# Patient Record
Sex: Male | Born: 2000 | Race: White | Hispanic: No | Smoking: Never smoker
Health system: Southern US, Community
[De-identification: ages and names within clinical notes are randomized; demographics above are authoritative.]

---
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
## 2021-01-27 ENCOUNTER — Ambulatory Visit (INDEPENDENT_AMBULATORY_CARE_PROVIDER_SITE_OTHER): Payer: BC Managed Care – PPO | Admitting: Adult Health

## 2021-01-27 ENCOUNTER — Encounter: Payer: Self-pay | Admitting: Adult Health

## 2021-01-27 ENCOUNTER — Other Ambulatory Visit: Payer: Self-pay

## 2021-01-27 DIAGNOSIS — F411 Generalized anxiety disorder: Secondary | ICD-10-CM | POA: Diagnosis not present

## 2021-01-27 DIAGNOSIS — G4700 Insomnia, unspecified: Secondary | ICD-10-CM | POA: Diagnosis not present

## 2021-01-27 NOTE — Progress Notes (Signed)
Jim Day [id number] 2000/02/01 21 y.o.  Subjective:   Patient ID:  Jim Day is a 21 y.o. (DOB 2000-07-15) male.  Chief Complaint: No chief complaint on file.   HPI Jim Day presents to the office today for follow-up of GAD and insomnia.  Describes mood today as "ok - tired". Pleasant. Denies tearfulness. Mood symptoms - denies depression - not really. Feels anxious and irritable at times - "for the most part". Reports a few panic attacks - "one to two". Stating "I'm not sure how I feel" - slept two hours and is tired.  Reports mood inconsistencies - "my mood can change pretty fast". Reports mind racing. Feels like the addition of Xanax has been helpful - not taking every day - "just when I need it". Stating "I can tell a difference with it". Stable interest and motivation. Taking medications as prescribed.  Energy levels stable. Active, does not have a regular exercise routine. Walking dogs. Enjoys some usual interests and activities. Dating - has a girlfriend of 10 months. She lives in [place]. Lives at home with parents - younger sister. Spending time with family. Appetite adequate. Weight stable. Sleeping difficulties. Typically averages 3 to 4 hours. Focus and concentration difficulties - "just a little bit". Completing tasks. Managing aspects of household. 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  Denies SI or HI.  Denies AH or VH.  Denies recent self harm.  Diagnosed with ADHD - took medications through 10th grade.   Previous medication trials:  Concerta, Zoloft, Prozac, Propranolol, Hydroxyzine  Review of Systems:  Review of Systems  Musculoskeletal:  Negative for gait problem.  Neurological:  Negative for tremors.  Psychiatric/Behavioral:         Please refer to HPI   Medications: I have reviewed the patient's current medications.  Current Outpatient Medications  Medication Sig Dispense Refill   ALPRAZolam (XANAX) 1 MG tablet Take 1 tablet (1 mg  total) by mouth daily as needed for anxiety. 30 tablet 2   No current facility-administered medications for this visit.    Medication Side Effects: None  Allergies:  Allergies  Allergen Reactions   Latex     No past medical history on file.  Past Medical History, Surgical history, Social history, and Family history were reviewed and updated as appropriate.   Please see review of systems for further details on the patient's review from today.   Objective:   Physical Exam:  There were no vitals taken for this visit.  Physical Exam Constitutional:      General: He is not in acute distress. Musculoskeletal:        General: No deformity.  Neurological:     Mental Status: He is alert and oriented to person, place, and time.     Coordination: Coordination normal.  Psychiatric:        Attention and Perception: Attention and perception normal. He does not perceive auditory or visual hallucinations.        Mood and Affect: Mood normal. Mood is not anxious or depressed. Affect is not labile, blunt, angry or inappropriate.        Speech: Speech normal.        Behavior: Behavior normal.        Thought Content: Thought content normal. Thought content is not paranoid or delusional. Thought content does not include homicidal or suicidal ideation. Thought content does not include homicidal or suicidal plan.        Cognition and Memory: Cognition and  memory normal.        Judgment: Judgment normal.     Comments: Insight intact    Lab Review:  No results found for: NA, K, CL, CO2, GLUCOSE, BUN, CREATININE, CALCIUM, PROT, ALBUMIN, AST, ALT, ALKPHOS, BILITOT, GFRNONAA, GFRAA  No results found for: WBC, RBC, HGB, HCT, PLT, MCV, MCH, MCHC, RDW, LYMPHSABS, MONOABS, EOSABS, BASOSABS  No results found for: POCLITH, LITHIUM   No results found for: PHENYTOIN, PHENOBARB, VALPROATE, CBMZ   .res Assessment: Plan:    Plan:   Continue Xanax 1mg  daily as needed for anxiety.  RTC 4  weeks  Patient advised to contact office with any questions, adverse effects, or acute worsening in signs and symptoms.   Time spent with patient was 15 minutes. Greater than 50% of face to face time with patient was spent on counseling and coordination of care.     Discussed potential benefits, risk, and side effects of benzodiazepines to include potential risk of tolerance and dependence, as well as possible drowsiness. Advised patient not to drive if experiencing drowsiness and to take lowest possible effective dose to minimize risk of dependence and tolerance.   Diagnoses and all orders for this visit:  Insomnia, unspecified type  GAD (generalized anxiety disorder)     Please see After Visit Summary for patient specific instructions.  Future Appointments  Date Time Provider Department Center  02/24/2021  4:40 PM Mateus Rewerts, 04/24/2021, NP [facility] None    No orders of the defined types were placed in this encounter.   -------------------------------

## 2021-02-24 ENCOUNTER — Ambulatory Visit: Payer: BC Managed Care – PPO | Admitting: Adult Health

## 2021-02-24 NOTE — Progress Notes (Signed)
Patient no show appointment. ? ?

## 2021-04-28 ENCOUNTER — Ambulatory Visit: Payer: 59 | Admitting: Internal Medicine

## 2021-11-16 ENCOUNTER — Ambulatory Visit: Payer: BC Managed Care – PPO | Admitting: Internal Medicine

## 2021-11-16 ENCOUNTER — Encounter: Payer: Self-pay | Admitting: Internal Medicine

## 2021-11-16 VITALS — BP 109/63 | HR 77 | Temp 98.3°F | Resp 12 | Ht 67.0 in | Wt 140.6 lb

## 2021-11-16 DIAGNOSIS — F419 Anxiety disorder, unspecified: Secondary | ICD-10-CM | POA: Diagnosis not present

## 2021-11-16 DIAGNOSIS — Z0000 Encounter for general adult medical examination without abnormal findings: Secondary | ICD-10-CM

## 2021-11-16 DIAGNOSIS — R634 Abnormal weight loss: Secondary | ICD-10-CM

## 2021-11-16 DIAGNOSIS — F17200 Nicotine dependence, unspecified, uncomplicated: Secondary | ICD-10-CM

## 2021-11-16 DIAGNOSIS — F17211 Nicotine dependence, cigarettes, in remission: Secondary | ICD-10-CM | POA: Diagnosis not present

## 2021-11-16 DIAGNOSIS — B350 Tinea barbae and tinea capitis: Secondary | ICD-10-CM | POA: Diagnosis not present

## 2021-11-16 HISTORY — DX: Nicotine dependence, unspecified, uncomplicated: F17.200

## 2021-11-16 HISTORY — DX: Tinea barbae and tinea capitis: B35.0

## 2021-11-16 HISTORY — DX: Abnormal weight loss: R63.4

## 2021-11-16 HISTORY — DX: Anxiety disorder, unspecified: F41.9

## 2021-11-16 MED ORDER — CLOTRIMAZOLE 1 % EX CREA: 1.0000 | TOPICAL_CREAM | Freq: Two times a day (BID) | CUTANEOUS | 11 refills | Status: AC

## 2021-11-16 NOTE — Assessment & Plan Note (Signed)
I talked to him for a while today and my impression is that he is not bipolar or OCD although maybe a little bit of trait for both I am going to avoid prescribing any psychiatric meds as a result because I am not sure on what the underlying causes for his anxiety are and he will call his normal psychiatrist and get back

## 2021-11-16 NOTE — Assessment & Plan Note (Signed)
Gave cancer screening recommendations to him in detail today Also gave healthy eating guidelines Will follow up in 1 year or sooner if he worsens (see AVS)

## 2021-11-16 NOTE — Progress Notes (Signed)
Today's healthcare provider: [name], MD  Phone: [phone]  New patient visit  Visit Date: 11/16/2021 Patient: Jim Day   DOB: 2001-01-15   21 y.o. Male  MRN: [id number]  Assessment and Plan:     Jim Day was seen today for establish care, discuss vaccines, ringworm and discuss sleeping issues.  Preventative health care -     CBC with Differential/Platelet -     Comprehensive metabolic panel -     Lipid panel  Tinea barbae Overview: He had has tinea corporis all over his body chest neck face and has not resolved with Lotrimin for 6 months we discussed the algorithm for treatment and I offered KOH prep, dermatology referral, antifungal cream continuation, by mouth antifungals.  For now he is just going to do the antifungal cream but it if he wants to call later for the dermatology or the by mouth antifungals I can send them in without an appointment assured him   Anxiety Overview: Managed by psychiatry I gave the number to get rescheduled as he missed an appointment  Assessment & Plan: I talked to him for a while today and my impression is that he is not bipolar or OCD although maybe a little bit of trait for both I am going to avoid prescribing any psychiatric meds as a result because I am not sure on what the underlying causes for his anxiety are and he will call his normal psychiatrist and get back  Orders: -     Clotrimazole; Apply 1 Application topically 2 (two) times daily.  Dispense: 100 g; Refill: 11  Cigarette nicotine dependence in remission  Recent weight loss Assessment & Plan: Gave cancer screening recommendations to him in detail today Also gave healthy eating guidelines Will follow up in 1 year or sooner if he worsens (see AVS)   Today's Health Maintenance Counseling and Anticipatory Guidance:  Eye exams:  Every 1-2 years was suggested Dental Health:  Reminded about importance of regular tooth brushing, flossing, and dental visits q6  months Sinus Hygiene:  Recommended daily sinus rinsing with sterile saline mist.  Cardiovascular Risk Factor Reduction:   Advised patient of need for regular exercise and diet rich and fruits and vegetables (high in fiber) and healthy fats (omega-3 unsaturated fats in fish/nuts/avocados/evoo) to reduce risk of heart attack and stroke. Avoid first and 2nd hand smoke and stimulants.   Avoid extreme exercise, only exercise in moderation Nutrition and Weight Management: recommended a diet to achieve a lean physique, in addition to a heart-healthy diet.  Avoid fattening foods with carbohydrates unless resistance training. Wt Readings from Last 3 Encounters:  11/16/21 140 lb 9.6 oz (63.8 kg)  12/20/18 125 lb 12.8 oz (57.1 kg) (17 %, Z= -0.95)*  12/04/18 125 lb (56.7 kg) (16 %, Z= -0.98)*   * Growth percentiles are based on CDC (Boys, 2-20 Years) data.   Body mass index is 22.02 kg/m. /  Substance use:  I discussed my recommendation of total abstinence from all substances of abuse including smoke(including 2nd hand), alcohol, illicit drugs, inhalants, sugar.   Offered to assist with any substance use disorders or addictions.   STD screening: testing offered today, but patient declined as he considers himself to be low risk based on his sexual history    Sleep Apnea screening:  He  denies any significant problems with sleep quality or hypersomnolence or being advised that he has been told that he snores or has apnea Depression screening: we discussed- he prefers  to get this managed by psychiatrist so the data is more privileged.  He does endorse anxiety and trauma history. Doubts bipolar.  Mild compulsivity endorsed.      No data to display         Counseled on value of gratitude exercises, mindfulness, physical exercise, social connections, and good sleep.  Encouraged him to use [facility] therapy chat for enlightenment counseling. Injury prevention: Discussed safety belts, safety helmets, smoke detectors.   Avoid adventure sports and high impact exercises. Health maintenance and immunizations reviewed and he was encouraged to complete anything that is due:  There is no immunization history on file for this patient. Health Maintenance Due  Topic Date Due   HPV VACCINES (1 - Male 2-dose series) Never done   TETANUS/TDAP  Never done   He declined both recommended vaccines for now after discussing in detail. Return to care in 1 year for next preventative visit.   Today's Cancer Screening Guidance: Penile & Testicular cancer screening:  He was advised to palpate his testicles, scrotum, and penis for masses and inform me of any.   Prostate cancer screening:  Denies family history of prostate cancer or hematospermia so too young for screening by current guidelines. No results found for: "PSA"     Colon cancer screening:  Denies strong family history of colon cancer or blood in stool so no screening is indicated until age 13.   Thyroid cancer screening:  encouraged to self exam thyroid for nodules every 6-12 months Skin cancer screening:  Advised regular sunscreen use.  Showed him pictures of melanomas for reference.  He denies worrisome, changing, or new skin lesions.    Health Maintenance  Topic Date Due   HPV VACCINES (1 - Male 2-dose series) Never done   TETANUS/TDAP  Never done   INFLUENZA VACCINE  Discontinued   Hepatitis C Screening  Discontinued   HIV Screening  Discontinued    Recommended follow up: No follow-ups on file.  Subjective:  Patient presents today to establish care.  Prior patient of pediatrics Dr. Benard Halsted..  Chief Complaint  Patient presents with   Establish Care    Fasting today, possibly open to blood work.   Discuss vaccines    Tdap and HPV.   Ringworm    Face, needs prescription cream.   Discuss sleeping issues    Goes to bed late and doesn't get much sleep. Doesn't really feel tired.    And when asked about his health goals he mentions he wants to try to  eat better wants to try to get more enlightened and also he wants to ask about his sleep but he does not really perceive it to be a problem like insomnia rather just he is listening to his body and he does not feel the need to get a better way  For history taking, I took a per problem history from the patient and chart review as follows: Problem  Tinea Barbae   He had has tinea corporis all over his body chest neck face and has not resolved with Lotrimin for 6 months we discussed the algorithm for treatment and I offered KOH prep, dermatology referral, antifungal cream continuation, by mouth antifungals.  For now he is just going to do the antifungal cream but it if he wants to call later for the dermatology or the by mouth antifungals I can send them in without an appointment assured him   Anxiety   Managed by psychiatry I gave the number  to get rescheduled as he missed an appointment   Nicotine Dependence  Recent Weight Loss     Depression Screen     No data to display         No results found for any visits on 11/16/21.   The following were reviewed and entered/updated in epic: Past Medical History:  Diagnosis Date   Anxiety 11/16/2021   Managed by psychiatry I gave the number to get rescheduled as he missed an appointment   Nicotine dependence 11/16/2021   Recent weight loss 11/16/2021   Tinea barbae 11/16/2021   He had has tinea corporis all over his body chest neck face and has not resolved with Lotrimin for 6 months we discussed the algorithm for treatment and I offered KOH prep, dermatology referral, antifungal cream continuation, by mouth antifungals.  For now he is just going to do the antifungal cream but it if he wants to call later for the dermatology or the by mouth antifungals I can send them in   Past Surgical History:  Procedure Laterality Date   KNEE CARTILAGE SURGERY  2020   Past Surgical History:  Procedure Laterality Date   KNEE CARTILAGE SURGERY  2020    Family Status  Relation Name Status   Mother  Alive   Father  Alive   Sister Samson Frederic Alive       Younger   MGM  Alive   MGF  Alive   PGM  Alive   PGF  Alive   Family History  Problem Relation Age of Onset   Healthy Mother    Healthy Father    Outpatient Medications Prior to Visit  Medication Sig Dispense Refill   ALPRAZolam (XANAX) 1 MG tablet Take 1 tablet (1 mg total) by mouth daily as needed for anxiety. (Patient taking differently: Take 1 mg by mouth daily as needed for anxiety. PRN) 30 tablet 2   No facility-administered medications prior to visit.    Allergies  Allergen Reactions   Latex    Social History   Tobacco Use   Smoking status: Never   Smokeless tobacco: Current  Substance Use Topics   Alcohol use: No    Alcohol/week: 0.0 standard drinks of alcohol   Drug use: No     There is no immunization history on file for this patient.  Review of Systems  Constitutional:  Positive for weight loss. Negative for chills, diaphoresis, fever and malaise/fatigue.  HENT:  Negative for congestion, ear discharge, ear pain, hearing loss, nosebleeds, sinus pain, sore throat and tinnitus.   Eyes:  Negative for blurred vision, double vision, photophobia, pain, discharge and redness.  Respiratory:  Negative for cough, hemoptysis, sputum production, shortness of breath, wheezing and stridor.   Cardiovascular:  Negative for chest pain, palpitations, orthopnea, claudication, leg swelling and PND.  Gastrointestinal:  Positive for constipation. Negative for abdominal pain, blood in stool, diarrhea, heartburn, melena, nausea and vomiting.  Genitourinary:  Negative for dysuria, flank pain, frequency, hematuria and urgency.  Musculoskeletal:  Negative for back pain, falls, joint pain, myalgias and neck pain.  Skin:  Negative for itching and rash.  Neurological:  Negative for dizziness, tingling, tremors, sensory change, speech change, focal weakness, seizures, loss of consciousness,  weakness and headaches.  Endo/Heme/Allergies:  Negative for environmental allergies and polydipsia. Does not bruise/bleed easily.  Psychiatric/Behavioral:  Positive for depression. Negative for hallucinations, memory loss, substance abuse and suicidal ideas. The patient is nervous/anxious (difficulty falling asleep, poor appetite, stress, irritability, racing thoughts, mood  swings, anxiety follows with psych) and has insomnia.      Objective:  BP 109/63 (BP Location: Right Arm, Patient Position: Sitting)   Pulse 77   Temp 98.3 F (36.8 C) (Temporal)   Resp 12   Ht 5\' 7"  (1.702 m)   Wt 140 lb 9.6 oz (63.8 kg)   SpO2 98%   BMI 22.02 kg/m  Body mass index is 22.02 kg/m.  He  is a very cordial and polite person who was a pleasure to meet.  Gen: NAD, resting comfortably HEENT: Mucous membranes are moist. Sclera conjunctiva and lids grossly normal Neck: no thyromegaly, no cervical lymphadenopathy CV: RRR no murmurs rubs or gallops Lungs: CTAB no crackles, wheeze, rhonchi\Ext: no edema Skin: warm, dry Neuro: grossly intact

## 2021-11-16 NOTE — Patient Instructions (Addendum)
It was a pleasure seeing you today! I truly hope you feel like you received 5 star service and please let me know if there is anything I can improve.  [name], MD   Today the plan is...  Preventative health care  Tinea barbae       [x]  No follow-ups on file.   [x]  If your condition begins to worsen or become severe:  GO to the ER  [x]  If you are not doing well: RETURN to the office sooner  [x]  If you have follow-up questions / concerns: please contact me  -via phone [phone] OR MyChart messaging   [x]  Please bring all your medicines to each appointment  [x]   You will be contacted with the lab results as soon as they are available. For any labs or imaging tests, we will call you if the results are significantly abnormal.  Most normal results will be posted to myChart as soon as they are available and I will comment on them there within 2-3 business days.   [x]  Billing questions for xray and lab orders are billed from separate companies and questions/concerns should be directed to the [facility].  For visit charges please discuss with our administrative services.  [x]  You will be contacted with the lab results as soon as they are available. The fastest way to get your results is to activate your My Chart account. Instructions are located on the last page of this paperwork. If you have not heard from [place] regarding the results in 2 weeks, please contact this office.

## 2022-11-18 ENCOUNTER — Encounter: Payer: Self-pay | Admitting: Internal Medicine

## 2022-11-18 ENCOUNTER — Ambulatory Visit (INDEPENDENT_AMBULATORY_CARE_PROVIDER_SITE_OTHER): Payer: BC Managed Care – PPO | Admitting: Internal Medicine

## 2022-11-18 VITALS — BP 98/60 | HR 64 | Temp 98.0°F | Ht 67.0 in | Wt 139.0 lb

## 2022-11-18 DIAGNOSIS — G44219 Episodic tension-type headache, not intractable: Secondary | ICD-10-CM

## 2022-11-18 DIAGNOSIS — E65 Localized adiposity: Secondary | ICD-10-CM | POA: Diagnosis not present

## 2022-11-18 DIAGNOSIS — L989 Disorder of the skin and subcutaneous tissue, unspecified: Secondary | ICD-10-CM

## 2022-11-18 DIAGNOSIS — R1115 Cyclical vomiting syndrome unrelated to migraine: Secondary | ICD-10-CM

## 2022-11-18 DIAGNOSIS — Z72821 Inadequate sleep hygiene: Secondary | ICD-10-CM

## 2022-11-18 DIAGNOSIS — D229 Melanocytic nevi, unspecified: Secondary | ICD-10-CM | POA: Diagnosis not present

## 2022-11-18 DIAGNOSIS — B350 Tinea barbae and tinea capitis: Secondary | ICD-10-CM

## 2022-11-18 DIAGNOSIS — Z0001 Encounter for general adult medical examination with abnormal findings: Secondary | ICD-10-CM | POA: Diagnosis not present

## 2022-11-18 DIAGNOSIS — R634 Abnormal weight loss: Secondary | ICD-10-CM

## 2022-11-18 DIAGNOSIS — Z23 Encounter for immunization: Secondary | ICD-10-CM | POA: Diagnosis not present

## 2022-11-18 DIAGNOSIS — G43009 Migraine without aura, not intractable, without status migrainosus: Secondary | ICD-10-CM

## 2022-11-18 DIAGNOSIS — F419 Anxiety disorder, unspecified: Secondary | ICD-10-CM | POA: Diagnosis not present

## 2022-11-18 DIAGNOSIS — Z0000 Encounter for general adult medical examination without abnormal findings: Secondary | ICD-10-CM

## 2022-11-18 LAB — LIPID PANEL
Cholesterol: 151 mg/dL (ref 0–200)
HDL: 66.5 mg/dL (ref >39.00)
LDL Cholesterol: 71 mg/dL (ref 0–99)
NonHDL: 84.4
Total CHOL/HDL Ratio: 2
Triglycerides: 65 mg/dL (ref 0.0–149.0)
VLDL: 13 mg/dL (ref 0.0–40.0)

## 2022-11-18 LAB — COMPREHENSIVE METABOLIC PANEL
ALT: 46 U/L (ref 0–53)
AST: 29 U/L (ref 0–37)
Albumin: 4.8 g/dL (ref 3.5–5.2)
Alkaline Phosphatase: 56 U/L (ref 39–117)
BUN: 14 mg/dL (ref 6–23)
CO2: 28 meq/L (ref 19–32)
Calcium: 9.6 mg/dL (ref 8.4–10.5)
Chloride: 104 meq/L (ref 96–112)
Creatinine, Ser: 0.98 mg/dL (ref 0.40–1.50)
GFR: 110.34 mL/min (ref >60.00)
Glucose, Bld: 92 mg/dL (ref 70–99)
Potassium: 4.1 meq/L (ref 3.5–5.1)
Sodium: 141 meq/L (ref 135–145)
Total Bilirubin: 0.4 mg/dL (ref 0.2–1.2)
Total Protein: 7.3 g/dL (ref 6.0–8.3)

## 2022-11-18 LAB — CBC WITH DIFFERENTIAL/PLATELET
Basophils Absolute: 0 10*3/uL (ref 0.0–0.1)
Basophils Relative: 0.5 % (ref 0.0–3.0)
Eosinophils Absolute: 0.1 10*3/uL (ref 0.0–0.7)
Eosinophils Relative: 2 % (ref 0.0–5.0)
HCT: 41.9 % (ref 39.0–52.0)
Hemoglobin: 14.2 g/dL (ref 13.0–17.0)
Lymphocytes Relative: 42.9 % (ref 12.0–46.0)
Lymphs Abs: 1.9 10*3/uL (ref 0.7–4.0)
MCHC: 33.8 g/dL (ref 30.0–36.0)
MCV: 89.7 fL (ref 78.0–100.0)
Monocytes Absolute: 0.3 10*3/uL (ref 0.1–1.0)
Monocytes Relative: 6.4 % (ref 3.0–12.0)
Neutro Abs: 2.1 10*3/uL (ref 1.4–7.7)
Neutrophils Relative %: 48.2 % (ref 43.0–77.0)
Platelets: 323 10*3/uL (ref 150.0–400.0)
RBC: 4.68 Mil/uL (ref 4.22–5.81)
RDW: 12.7 % (ref 11.5–15.5)
WBC: 4.4 10*3/uL (ref 4.0–10.5)

## 2022-11-18 NOTE — Progress Notes (Signed)
[facility]: [phone]   -- Annual Preventive Medical Office Visit --  Patient:  Jim Day      Age: 22 y.o.       Sex:  male  Date:   11/18/2022 Patient Care Team: Lula Olszewski, MD as PCP - General (Internal Medicine) Mozingo, Thereasa Solo, NP as Nurse Practitioner (Psychiatry) Today's Healthcare Provider: Lula Olszewski, MD  ========================================= Chief Complaint  Patient presents with   Annual Exam    Pt here for annual exam - pt states that he has a mole on his back he would like to have looked at - pt fasting. Pt open to discuss HPV and Tdap vaccine    Purpose of Visit: Comprehensive preventive health assessment and personalized health maintenance planning.  This encounter was conducted as a Comprehensive Physical Exam (CPE) preventive care annual visit. The patient's medical history and problem list were reviewed to inform individualized preventive care recommendations. No problem-specific medical treatment was provided during this visit.    Assessment & Plan Encounter for annual health examination  Preventative health care  Recent weight loss  Anxiety Off medications No longer following with behavioral health  Cyclic vomiting syndrome  Episodic tension-type headache, not intractable  Migraine without aura and without status migrainosus, not intractable  Poor sleep hygiene  Tinea barbae  Encounter for annual general medical examination with abnormal findings in adult  Skin lesion  Melanocytic neoplasm of skin  Central adiposity  Assessment and Plan    Melanocytic Neoplasm   A dark, varying, and growing mole on his back warrants an urgent referral to dermatology for evaluation and possible excision due to its concerning features.  Anxiety   He reports a mild anxiety disorder and has been previously prescribed Xanax by [facility], which he uses rarely for panic attacks. We will prescribe 12  tablets of Xanax for rare use and encourage continued follow-up with psychiatry.  Poor Sleep Hygiene   He reports late sleep times and variable sleep duration but no daytime sleepiness or snoring. We will educate him on the importance of a consistent sleep schedule for overall health.  General Health Maintenance   We will administer the Tdap vaccine today, order baseline blood work, and encourage regular dental visits, eye exams, and self-examination for testicular cancer. Regular exercise and a healthy diet, including daily avocado intake, are recommended. We advise on the use of nasal filters or sinus sprays for occupational dust exposure.      Diagnoses and all orders for this visit: Encounter for annual health examination Preventative health care Recent weight loss Anxiety Cyclic vomiting syndrome Episodic tension-type headache, not intractable Migraine without aura and without status migrainosus, not intractable Poor sleep hygiene Tinea barbae Encounter for annual general medical examination with abnormal findings in adult Skin lesion -     Ambulatory referral to Dermatology Melanocytic neoplasm of skin -     Ambulatory referral to Dermatology Central adiposity -     Tdap vaccine greater than or equal to 7yo IM -     Ambulatory referral to Dermatology -     Lipid panel -     Comprehensive metabolic panel -     CBC with Differential/Platelet -     TSH Rfx on Abnormal to Free T4 -     Lipid Panel w/reflex Direct LDL   Today's Health Maintenance Counseling and Anticipatory Guidance:  Eye exams:  We encouraged patient to to complete eye exam every 1-2 years.  He  reports his last eye exam was:  2 years   Dental health:  He reports he has been keeping up with his dental visits.  He intends to do so in the future.  We encourage regular tooth brushing/flossing. Sinus health:  We encouraged sterile saline nasal misting sinus rinses daily for pollen, to reduce allergies and risk for  sinus infections.   Sterile can based misting products are recommended due to superior misting and ease of maintaining sterility Sleep Apnea screening:  We encourage self monitoring of sleep quality with SnoreLab App and other tools/apps that are now available at retail []  Do you snore loudly - only rarely not loud []  Tired, fatigued, or sleepy during the daytime []  Witness apneas []  Significant hypertension []  BMI greater than 35    []  Age older than 21 years old []  Has large neck size over 15.7 in [x]  Male Total Score: 1  Cardiovascular Risk Factor Reduction:   Advised patient of need for regular exercise and diet rich and fruits and vegetables and healthy fats to reduce risk of heart attack and stroke.  Avoid first- and second-hand smoke and stimulants.   Avoid extreme exercise- exercise in moderation (150 minutes per week is a good goal) Discussed health benefits of physical activity, and encouraged him to engage in regular exercise appropriate for his age and condition. Exercise Activities and Dietary recommendations  Goals   None    Wt Readings from Last 3 Encounters:  11/18/22 139 lb (63 kg)  11/16/21 140 lb 9.6 oz (63.8 kg)  12/20/18 125 lb 12.8 oz (57.1 kg) (17%, Z= -0.95)*   * Growth percentiles are based on CDC (Boys, 2-20 Years) data.  Body mass index is 21.77 kg/m. Health maintenance and immunizations reviewed and he was encouraged to complete anything that is due: Immunization History  Administered Date(s) Administered   Tdap 11/18/2022   There are no preventive care reminders to display for this patient.  Health Maintenance  Topic Date Due   COVID-19 Vaccine (1) 12/04/2022 (Originally 07/08/2006)   HPV VACCINES (1 - Risk male 3-dose series) 11/18/2023 (Originally 07/07/2012)   DTaP/Tdap/Td (2 - Td or Tdap) 11/17/2032   INFLUENZA VACCINE  Discontinued   Hepatitis C Screening  Discontinued   HIV Screening  Discontinued   Lifestyle / Social History Reviewed:  Social  History   Tobacco Use   Smoking status: Never   Smokeless tobacco: Current  Substance Use Topics   Alcohol use: No    Alcohol/week: 0.0 standard drinks of alcohol   Drug use: No    My recommendation is total abstinence from all substances of abuse including smoke and 2nd hand smoke, alcohol, illicit drugs, smoking, inhalants, sugar, high risk sexual behavior Offered to assist with any use disorders or addictions.   Sexual transmitted infection testing offered today, but patient declined as he feels he is low risk based on his sexual history.   Social History   Substance and Sexual Activity  Sexual Activity Never  .      11/18/2022   12:35 PM  Depression screen PHQ 2/9  Decreased Interest 1  Down, Depressed, Hopeless 0  PHQ - 2 Score 1  Altered sleeping 2  Tired, decreased energy 1  Change in appetite 2  Feeling bad or failure about yourself  0  Trouble concentrating 0  Moving slowly or fidgety/restless 1  Suicidal thoughts 0  PHQ-9 Score 7  Difficult doing work/chores Somewhat difficult  Injury prevention: Discussed safety belts, safety helmets,  not texting and driving.  Today's Cancer Screening  Penile/Testicle/Scrotum cancer screening: Asked the patient about genital warts or tumors/abnormalities of penis/testicles/scrotum, encouraged patient to to inform me of any. Patient reports there are none. Thyroid cancer screening: patient advised to check by palpating thyroid for nodules Prostate cancer screening:  Denies family history of prostate cancer or hematospermia so too young for screening by current guidelines.(No results found for: "PSA") Colon cancer screening:  Denies strong family history of colon cancer or blood in stool so no screening is indicated until age 75.   Lung cancer screening:  Current guidelines recommend Individuals aged 32 to 52 who currently smoke or formerly smoked and have a >= 20 pack-year smoking history should undergo annual screening with low-dose  computed tomography (LDCT). Skin cancer screening:  Advised regular sunscreen use. He denies worrisome, changing, or new skin lesions. Showed him pictures of melanomas for reference:  Return to care in 1 year for next preventative visit.      Subjective  22 y.o. male presents today for a complete physical exam.  He reports consuming a general diet. The patient does not participate in regular exercise at present. He generally feels well. He reports sleeping fairly well. He does not have additional problems to discuss today.  HPI HPI     Annual Exam    Additional comments: Pt here for annual exam - pt states that he has a mole on his back he would like to have looked at - pt fasting. Pt open to discuss HPV and Tdap vaccine      Last edited by Samara Deist, CMA on 11/18/2022 12:39 PM.      AI-Extracted: Discussed the use of AI scribe software for clinical note transcription with the patient, who gave verbal consent to proceed.  History of Present Illness   The patient, a young adult, presented for a routine physical examination. The patient reported no specific complaints or health concerns. The patient's lifestyle includes a physically active job and a passion for music. The patient reported a history of attention deficit disorder (ADD) and occasional migraines. The patient also reported a history of chronic nausea and cyclic vomiting syndrome, which has been well-controlled recently.  The patient reported a history of nicotine dependence but has since quit. The patient denied any alcohol issues and reported a stable relationship with a long-term partner. The patient reported poor sleep hygiene, often staying up late, but did not report any daytime sleepiness or snoring.  The patient reported a history of tinea barbae, a fungal infection on the skin, which occasionally flares up with heavy sweating but is generally well-controlled with topical treatment. The patient also reported a  mole on the back that has been present for several years. The mole has darkened and spread, raising concerns about potential melanoma.  The patient reported no regular exercise outside of his physically demanding job. The patient's diet was reported to be dominated by affordability, including frequent consumption of ramen and rice. 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  The patient reported a history of anxiety, which has improved significantly. The patient has not been following up with a therapist or psychiatrist and has been using music as a form of therapy. The patient reported having a small supply of Xanax for occasional use during periods of high anxiety.  In summary, the patient is a young adult with a history of ADD, migraines, chronic nausea, cyclic vomiting syndrome, tinea  barbae, and anxiety. The patient presented for a routine physical examination with no specific complaints. The patient reported poor sleep hygiene and a diet dominated by affordability. The patient has a mole on the back that has darkened and spread, raising concerns about potential melanoma. The patient's anxiety has improved significantly, and he has been using music as a form of therapy.      ROSA comprehensive ROS was negative for any concerning symptoms.  Disclaimer about ROS at Annual Preventive Visits Patients are informed before the Review of Systems (ROS) that identifying significant medical issues during the wellness visit may require immediate attention, potentially resulting in a separate billable encounter beyond the scope of the preventive exam. This disclosure is mandated by professional ethics and legal obligations, as healthcare providers must address any substantial health concerns raised during any patient interaction.  A comprehensive ROS is required by insurance companies for billing the visit. However, this structure may inadvertently discourage  patients from fully disclosing health concerns due to potential financial implications. Consequently, patients often emphasize that any positive ROS findings are related to stable chronic conditions, requesting that these not be discussed during the preventive visit to avoid additional charges. Patients may also ask that reported complaints not be listed in the ROS to prevent affecting billing.  Problem list overviews that were updated at today's visit: Problem  Anxiety   Managed by psychiatry    11/18/2022   12:35 PM  PHQ9 SCORE ONLY  PHQ-9 Total Score 7       11/18/2022   12:36 PM  GAD 7 : Generalized Anxiety Score  Nervous, Anxious, on Edge 1  Control/stop worrying 1  Worry too much - different things 1  Trouble relaxing 2  Restless 2  Easily annoyed or irritable 1  Afraid - awful might happen 0  Total GAD 7 Score 8  Anxiety Difficulty Somewhat difficult       Cyclic Vomiting Syndrome  Nicotine Dependence (Resolved)  Recent Weight Loss (Resolved)   Wt Readings from Last 10 Encounters:  11/18/22 139 lb (63 kg)  11/16/21 140 lb 9.6 oz (63.8 kg)  12/20/18 125 lb 12.8 oz (57.1 kg) (17%, Z= -0.95)*  12/04/18 125 lb (56.7 kg) (16%, Z= -0.98)*  09/08/18 114 lb 9.6 oz (52 kg) (6%, Z= -1.53)*  06/27/14 110 lb 12.8 oz (50.3 kg) (69%, Z= 0.49)*   * Growth percentiles are based on CDC (Boys, 2-20 Years) data.         I attest that I have reviewed and confirmed the patients current medications to meet the medication reconciliation requirement  Current Outpatient Medications on File Prior to Visit  Medication Sig   clotrimazole (ANTIFUNGAL CLOTRIMAZOLE) 1 % cream Apply 1 Application topically 2 (two) times daily.   ALPRAZolam (XANAX) 1 MG tablet Take 1 tablet (1 mg total) by mouth daily as needed for anxiety. (Patient not taking: Reported on 11/18/2022)   No current facility-administered medications on file prior to visit.  There are no discontinued medications. Outpatient  Medications Prior to Visit  Medication Sig   clotrimazole (ANTIFUNGAL CLOTRIMAZOLE) 1 % cream Apply 1 Application topically 2 (two) times daily.   ALPRAZolam (XANAX) 1 MG tablet Take 1 tablet (1 mg total) by mouth daily as needed for anxiety. (Patient not taking: Reported on 11/18/2022)   No facility-administered medications prior to visit.  Review of Systems The following were reviewed and entered/updated into our electronic MEDICAL RECORD NUMBERPast Medical History:  Diagnosis Date   Anxiety 11/16/2021  Managed by psychiatry I gave the number to get rescheduled as he missed an appointment   Nicotine dependence 11/16/2021   Nicotine dependence 11/16/2021   Recent weight loss 11/16/2021   Tinea barbae 11/16/2021   He had has tinea corporis all over his body chest neck face and has not resolved with Lotrimin for 6 months we discussed the algorithm for treatment and I offered KOH prep, dermatology referral, antifungal cream continuation, by mouth antifungals.  For now he is just going to do the antifungal cream but it if he wants to call later for the dermatology or the by mouth antifungals I can send them in   Past Surgical History:  Procedure Laterality Date   KNEE CARTILAGE SURGERY  2020   Social History   Socioeconomic History   Marital status: Significant Other    Spouse name: Not on file   Number of children: Not on file   Years of education: Not on file   Highest education level: Not on file  Occupational History   Not on file  Tobacco Use   Smoking status: Never   Smokeless tobacco: Current  Substance and Sexual Activity   Alcohol use: No    Alcohol/week: 0.0 standard drinks of alcohol   Drug use: No   Sexual activity: Never  Other Topics Concern   Not on file  Social History Narrative   Lesslie is a 12th grade student.   He attends [employer].   He lives with both parents.   He has one sister.   Social Determinants of Corporate investment banker Strain: Not  on file  Food Insecurity: Not on file  Transportation Needs: Not on file  Physical Activity: Not on file  Stress: Not on file  Social Connections: Not on file  Intimate Partner Violence: Not on file   Family Status  Relation Name Status   Mother  Alive   Father  Alive   Sister Samson Frederic Alive       Younger   MGM  Alive   MGF  Alive   PGM  Alive   PGF  Alive  No partnership data on file   Family History  Problem Relation Age of Onset   Healthy Mother    Healthy Father    Allergies  Allergen Reactions   Latex    Patient Care Team: Lula Olszewski, MD as PCP - General (Internal Medicine) Mozingo, Thereasa Solo, NP as Nurse Practitioner (Psychiatry)      Objective  BP 98/60   Pulse 64   Temp 98 F (36.7 C)   Ht 5\' 7"  (1.702 m)   Wt 139 lb (63 kg)   SpO2 98%   BMI 21.77 kg/m  BP Readings from Last 3 Encounters:  11/18/22 98/60  11/16/21 109/63  12/20/18 100/70 (8%, Z = -1.41 /  64%, Z = 0.36)*   *BP percentiles are based on the 2017 AAP Clinical Practice Guideline for boys   Wt Readings from Last 3 Encounters:  11/18/22 139 lb (63 kg)  11/16/21 140 lb 9.6 oz (63.8 kg)  12/20/18 125 lb 12.8 oz (57.1 kg) (17%, Z= -0.95)*   * Growth percentiles are based on CDC (Boys, 2-20 Years) data.      Physical Exam  Wt Readings from Last 3 Encounters:  11/18/22 139 lb (63 kg)  11/16/21 140 lb 9.6 oz (63.8 kg)  12/20/18 125 lb 12.8 oz (57.1 kg) (17%, Z= -0.95)*   * Growth percentiles are based  on CDC (Boys, 2-20 Years) data.  Physical Exam   HEENT: Ears healthy looking, right ear with slight inflammation. Throat normal. NECK: No lumps or bumps. ABDOMEN: No tenderness, no lumps or bumps. SKIN: No acne or significant skin lesions noted.     GEN: NAD, Resting Comfortably. HEENT: Tympanic membranes normal appearing bilaterally, Oropharynx clear, No Thyromegaly noted. No palpable lymphadenopathy or thyroid nodules. CARDIOVASCULAR: S1 and S2 heart sounds have regular  rate and rhythm with no murmurs appreciated. PULMONARY:  Normal work of breathing. Clear to auscultation bilaterally with no crackles, wheezes, or rhonchi. ABDOMEN: Soft, Nontender, Nondistended.  MSK: No edema, cyanosis, or clubbing noted. SKIN: Warm, dry, no lesions of concern observed. NEURO: CN2-12 grossly intact. Strength 5/5 in upper and lower extremities. Reflexes symmetric and intact bilaterally.  PSYCH: Normal affect and thought content, pleasant and cooperative. Photographs Taken 11/18/2022 :      Last depression screening scores    11/18/2022   12:35 PM  PHQ 2/9 Scores  PHQ - 2 Score 1  PHQ- 9 Score 7   Last fall risk screening    11/18/2022   12:35 PM  Fall Risk   Falls in the past year? 0  Number falls in past yr: 0  Injury with Fall? 0  Risk for fall due to : No Fall Risks  Follow up Falls evaluation completed   Last Audit-C alcohol use screening     No data to display            A score of 3 or more in women, and 4 or more in men indicates increased risk for alcohol abuse, EXCEPT if all of the points are from question 1  Last CBC Lab Results  Component Value Date   WBC 4.4 11/18/2022   HGB 14.2 11/18/2022   HCT 41.9 11/18/2022   MCV 89.7 11/18/2022   RDW 12.7 11/18/2022   PLT 323.0 11/18/2022   Last metabolic panel Lab Results  Component Value Date   GLUCOSE 92 11/18/2022   NA 141 11/18/2022   K 4.1 11/18/2022   CL 104 11/18/2022   CO2 28 11/18/2022   BUN 14 11/18/2022   CREATININE 0.98 11/18/2022   GFR 110.34 11/18/2022   CALCIUM 9.6 11/18/2022   PROT 7.3 11/18/2022   ALBUMIN 4.8 11/18/2022   BILITOT 0.4 11/18/2022   ALKPHOS 56 11/18/2022   AST 29 11/18/2022   ALT 46 11/18/2022   Last lipids Lab Results  Component Value Date   CHOL 151 11/18/2022   HDL 66.50 11/18/2022   LDLCALC 71 11/18/2022   TRIG 65.0 11/18/2022   CHOLHDL 2 11/18/2022   Last hemoglobin A1c No results found for: "HGBA1C" Last thyroid functions No  results found for: "TSH", "T3TOTAL", "T4TOTAL", "THYROIDAB" Last vitamin D No results found for: "25OHVITD2", "25OHVITD3", "VD25OH" Last vitamin B12 and Folate No results found for: "ZOXWRUEA54", "FOLATE"      Lula Olszewski, MD  [facility] [phone] (phone) [phone] (fax) [facility]

## 2022-11-18 NOTE — Assessment & Plan Note (Addendum)
Off medications No longer following with behavioral health

## 2022-11-18 NOTE — Patient Instructions (Signed)
VISIT SUMMARY:  Today, you came in for a routine physical examination. You reported no specific complaints but shared your history of attention deficit disorder (ADD), occasional migraines, chronic nausea, cyclic vomiting syndrome, tinea barbae, and anxiety. You also mentioned a mole on your back that has darkened and spread, raising concerns about potential melanoma. 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  YOUR PLAN:  -MELANOCYTIC NEOPLASM: A melanocytic neoplasm is a type of mole that can potentially develop into skin cancer. Due to the changes in your mole, we are referring you to a dermatologist for an urgent evaluation and possible removal.  -ANXIETY: Anxiety is a feeling of worry or fear that can be mild or severe. You have been managing your anxiety with music and occasional use of Xanax. We will prescribe 12 tablets of Xanax for rare use and encourage you to continue follow-up with a psychiatrist.  -POOR SLEEP HYGIENE: Poor sleep hygiene refers to habits that negatively impact your sleep quality. We discussed the importance of maintaining a consistent sleep schedule to improve your overall health.  -GENERAL HEALTH MAINTENANCE: We will give you the Tdap vaccine today and order baseline blood work. We recommend regular dental visits, eye exams, and self-examinations for testicular cancer. Additionally, we advise regular exercise, a healthy diet including daily avocado intake, and using nasal filters or sinus sprays for dust exposure at work.  INSTRUCTIONS:  Please follow up with the dermatologist as soon as possible for the evaluation of your mole. Continue to manage your anxiety with music and use Xanax sparingly. Maintain a consistent sleep schedule and follow the general health maintenance recommendations provided. Schedule regular check-ups with your dentist and eye doctor, and perform  self-examinations for testicular cancer.

## 2022-11-19 LAB — LIPID PANEL W/REFLEX DIRECT LDL
Cholesterol: 155 mg/dL (ref <200)
HDL: 69 mg/dL (ref >=40)
LDL Cholesterol (Calc): 72 mg/dL
Non-HDL Cholesterol (Calc): 86 mg/dL (ref <130)
Total CHOL/HDL Ratio: 2.2 (calc) (ref <5.0)
Triglycerides: 67 mg/dL (ref <150)

## 2022-11-19 LAB — TSH RFX ON ABNORMAL TO FREE T4: TSH: 1.22 u[IU]/mL (ref 0.450–4.500)

## 2022-11-21 ENCOUNTER — Encounter: Payer: Self-pay | Admitting: Internal Medicine

## 2022-11-21 NOTE — Progress Notes (Signed)
Reviewed comprehensive health screening labs from 11/18/2022. All results within normal limits including lipids, metabolic panel, CBC, and TSH. Lipid panel notably optimal with HDL 66.5, LDL 71. Annual follow-up appropriate. Dermatology referral placed for changing mole. See detailed explanation in Patient Message.

## 2022-11-24 NOTE — Telephone Encounter (Signed)
Patient's review of lab results/notes confirmed.

## 2022-12-01 ENCOUNTER — Encounter: Payer: Self-pay | Admitting: Psychiatry

## 2023-05-19 ENCOUNTER — Ambulatory Visit: Payer: BC Managed Care – PPO | Admitting: Dermatology

## 2023-05-19 ENCOUNTER — Encounter: Payer: Self-pay | Admitting: Dermatology

## 2023-05-19 DIAGNOSIS — D489 Neoplasm of uncertain behavior, unspecified: Secondary | ICD-10-CM

## 2023-05-19 DIAGNOSIS — D492 Neoplasm of unspecified behavior of bone, soft tissue, and skin: Secondary | ICD-10-CM

## 2023-05-19 DIAGNOSIS — D225 Melanocytic nevi of trunk: Secondary | ICD-10-CM | POA: Diagnosis not present

## 2023-05-19 DIAGNOSIS — Z808 Family history of malignant neoplasm of other organs or systems: Secondary | ICD-10-CM | POA: Diagnosis not present

## 2023-05-19 NOTE — Progress Notes (Unsigned)
 New Patient Visit   Subjective  Jim Day is a 23 y.o. male who presents for the following: skin lesion  Patient states he has skin lesion located at the back that he would like to have examined. Patient reports the areas have been there for 8 years. He reports the areas are not bothersome.Patient rates irritation 2 out of 10. He states that the areas have not spread. Patient reports he  has not previously been treated for these areas. Patient reports that he never used any otc on the area. Patient reports there is family hx of skin cancer    The following portions of the chart were reviewed this encounter and updated as appropriate: medications, allergies, medical history  Review of Systems:  No other skin or systemic complaints except as noted in HPI or Assessment and Plan.  Objective  Well appearing patient in no apparent distress; mood and affect are within normal limits.    A focused examination was performed of the following areas: Back  Relevant exam findings are noted in the Assessment and Plan.  A- 6.39mm irregular papule superior  B-63mm left lower inferior C- 8mm  Right Lower Back 6.56mm irregular brown papule Left Lower Back 7mm irregular brown papule Left Lower Back 8mm irregular brown papule  Assessment & Plan   1. Suspicious moles on back - Assessment: Patient presents with multiple moles on the back, including three larger irregular ones that require further evaluation. Given the patient's family history of skin cancer (mother with history of skin cancer 5 years ago, surgically treated), these lesions warrant biopsy. Dermatoscopic examination was performed, revealing concerning features in three specific moles. - Plan:    Perform shave biopsies of 3 irregular moles on the back:     - Left lower back superior: 6.5 mm irregular brown papule     - Left lower back inferior: brown papule     - Right lower back: brown papule    Send biopsy specimens to lab  for histopathological examination    Provide patient with Aquaphor sample for post-biopsy wound care    Instruct patient on post-biopsy care:     - Keep area clean with soap and water     - Apply Vaseline or Aquaphor to biopsy sites     - Cover with Band-Aid     - Expect healing in about one week    Communicate biopsy results to patient via MyChart    Follow-up based on biopsy results:     - If normal: no further action required     - If abnormal: schedule for potential surgical excision   NEOPLASM OF UNCERTAIN BEHAVIOR (3) Right Lower Back Skin / nail biopsy Type of biopsy: tangential   Informed consent: discussed and consent obtained   Timeout: patient name, date of birth, surgical site, and procedure verified   Procedure prep:  Patient was prepped and draped in usual sterile fashion Prep type:  Isopropyl alcohol Anesthesia: the lesion was anesthetized in a standard fashion   Anesthetic:  1% lidocaine w/ epinephrine 1-100,000 buffered w/ 8.4% NaHCO3 Instrument used: DermaBlade   Hemostasis achieved with: aluminum chloride   Outcome: patient tolerated procedure well   Post-procedure details: sterile dressing applied and wound care instructions given   Dressing type: bandage and petrolatum   Specimen 1 - Surgical pathology Differential Diagnosis: r/o diplastic nevus  Check Margins: yes Left Lower Back Skin / nail biopsy Type of biopsy: tangential   Informed consent: discussed and  consent obtained   Timeout: patient name, date of birth, surgical site, and procedure verified   Procedure prep:  Patient was prepped and draped in usual sterile fashion Prep type:  Isopropyl alcohol Anesthesia: the lesion was anesthetized in a standard fashion   Anesthetic:  1% lidocaine w/ epinephrine 1-100,000 buffered w/ 8.4% NaHCO3 Instrument used: DermaBlade   Hemostasis achieved with: aluminum chloride   Outcome: patient tolerated procedure well   Post-procedure details: sterile dressing  applied and wound care instructions given   Dressing type: petrolatum and bandage   Specimen 2 - Surgical pathology Differential Diagnosis: r/o dystic plastic nevus  Check Margins: yes Left Lower Back Skin / nail biopsy Type of biopsy: tangential   Informed consent: discussed and consent obtained   Timeout: patient name, date of birth, surgical site, and procedure verified   Procedure prep:  Patient was prepped and draped in usual sterile fashion Prep type:  Isopropyl alcohol Anesthesia: the lesion was anesthetized in a standard fashion   Anesthetic:  1% lidocaine w/ epinephrine 1-100,000 buffered w/ 8.4% NaHCO3 Instrument used: DermaBlade   Hemostasis achieved with: aluminum chloride   Outcome: patient tolerated procedure well   Post-procedure details: sterile dressing applied and wound care instructions given   Dressing type: bandage and petrolatum   Specimen 3 - Surgical pathology Differential Diagnosis: r/o  Check Margins: yes  No follow-ups on file.  Exie Holler, CMA, am acting as scribe for [employer], DO.   Documentation: I have reviewed the above documentation for accuracy and completeness, and I agree with the above.  Louana Roup, DO

## 2023-05-19 NOTE — Patient Instructions (Signed)
 Important Information  Due to recent changes in healthcare laws, you may see results of your pathology and/or laboratory studies on MyChart before the doctors have had a chance to review them. We understand that in some cases there may be results that are confusing or concerning to you. Please understand that not all results are received at the same time and often the doctors may need to interpret multiple results in order to provide you with the best plan of care or course of treatment. Therefore, we ask that you please give [place] 2 business days to thoroughly review all your results before contacting the office for clarification. Should we see a critical lab result, you will be contacted sooner.   If You Need Anything After Your Visit  If you have any questions or concerns for your doctor, please call our main line at [phone] If no one answers, please leave a voicemail as directed and we will return your call as soon as possible. Messages left after 4 pm will be answered the following business day.   You may also send [place] a message via MyChart. We typically respond to MyChart messages within 1-2 business days.  For prescription refills, please ask your pharmacy to contact our office. Our fax number is [phone].  If you have an urgent issue when the clinic is closed that cannot wait until the next business day, you can page your doctor at the number below.    Please note that while we do our best to be available for urgent issues outside of office hours, we are not available 24/7.   If you have an urgent issue and are unable to reach [place], you may choose to seek medical care at your doctor's office, retail clinic, urgent care center, or emergency room.  If you have a medical emergency, please immediately call 911 or go to the emergency department. In the event of inclement weather, please call our main line at [phone] for an update on the status of any delays or  closures.  Dermatology Medication Tips: Please keep the boxes that topical medications come in in order to help keep track of the instructions about where and how to use these. Pharmacies typically print the medication instructions only on the boxes and not directly on the medication tubes.   If your medication is too expensive, please contact our office at [phone] or send [place] a message through MyChart.   We are unable to tell what your co-pay for medications will be in advance as this is different depending on your insurance coverage. However, we may be able to find a substitute medication at lower cost or fill out paperwork to get insurance to cover a needed medication.   If a prior authorization is required to get your medication covered by your insurance company, please allow [place] 1-2 business days to complete this process.  Drug prices often vary depending on where the prescription is filled and some pharmacies may offer cheaper prices.  The website www.goodrx.com contains coupons for medications through different pharmacies. The prices here do not account for what the cost may be with help from insurance (it may be cheaper with your insurance), but the website can give you the price if you did not use any insurance.  - You can print the associated coupon and take it with your prescription to the pharmacy.  - You may also stop by our office during regular business hours and pick up a GoodRx coupon card.  - If  you need your prescription sent electronically to a different pharmacy, notify our office through [facility] or by phone at [phone]     Patient Handout: Wound Care for Skin Biopsy Site  Taking Care of Your Skin Biopsy Site  Proper care of the biopsy site is essential for promoting healing and minimizing scarring. This handout provides instructions on how to care for your biopsy site to ensure optimal recovery.  1. Cleaning the Wound:  Clean the biopsy site daily  with gentle soap and water. Gently pat the area dry with a clean, soft towel. Avoid harsh scrubbing or rubbing the area, as this can irritate the skin and delay healing.  2. Applying Aquaphor and Bandage:  After cleaning the wound, apply a thin layer of Aquaphor ointment to the biopsy site. Cover the area with a sterile bandage to protect it from dirt, bacteria, and friction. Change the bandage daily or as needed if it becomes soiled or wet.  3. Continued Care for One Week:  Repeat the cleaning, Aquaphor application, and bandaging process daily for one week following the biopsy procedure. Keeping the wound clean and moist during this initial healing period will help prevent infection and promote optimal healing.  4. Massaging Aquaphor into the Area:  ---After one week, discontinue the use of bandages but continue to apply Aquaphor to the biopsy site. ----Gently massage the Aquaphor into the area using circular motions. ---Massaging the skin helps to promote circulation and prevent the formation of scar tissue.   Additional Tips:  Avoid exposing the biopsy site to direct sunlight during the healing process, as this can cause hyperpigmentation or worsen scarring. If you experience any signs of infection, such as increased redness, swelling, warmth, or drainage from the wound, contact your healthcare provider immediately. Follow any additional instructions provided by your healthcare provider for caring for the biopsy site and managing any discomfort. Conclusion:  Taking proper care of your skin biopsy site is crucial for ensuring optimal healing and minimizing scarring. By following these instructions for cleaning, applying Aquaphor, and massaging the area, you can promote a smooth and successful recovery. If you have any questions or concerns about caring for your biopsy site, don't hesitate to contact your healthcare provider for guidance.

## 2023-05-24 LAB — SURGICAL PATHOLOGY

## 2023-05-27 ENCOUNTER — Encounter: Payer: Self-pay | Admitting: Dermatology

## 2023-05-27 NOTE — Progress Notes (Signed)
 Hi Webber,  Dr. Myrtie Atkinson reviewed your biopsy results and they showed the spot removed was a little "abnormal" but not cancerous.  No additional treatment is required.  We will continue to monitor the area for re-pigmentation during your annual skin exams. The detailed report is available to view in MyChart.  Have a great day!  Kind Regards,  Dr. Christiane Cowing Care Team

## 2023-11-23 ENCOUNTER — Encounter: Payer: BC Managed Care – PPO | Admitting: Internal Medicine

## 2024-01-25 ENCOUNTER — Encounter: Admitting: Internal Medicine
